# Patient Record
Sex: Female | Born: 1955 | Race: Black or African American | Marital: Married | Smoking: Never smoker
Health system: Southern US, Community
[De-identification: ages and names within clinical notes are randomized; demographics above are authoritative.]

## PROBLEM LIST (undated history)

## (undated) DIAGNOSIS — I10 Essential (primary) hypertension: Secondary | ICD-10-CM

## (undated) DIAGNOSIS — M549 Dorsalgia, unspecified: Secondary | ICD-10-CM

## (undated) DIAGNOSIS — M1990 Unspecified osteoarthritis, unspecified site: Secondary | ICD-10-CM

## (undated) DIAGNOSIS — E7800 Pure hypercholesterolemia, unspecified: Secondary | ICD-10-CM

## (undated) DIAGNOSIS — I2510 Atherosclerotic heart disease of native coronary artery without angina pectoris: Secondary | ICD-10-CM

## (undated) HISTORY — PX: BUNIONECTOMY: SHX129

## (undated) HISTORY — PX: BACK SURGERY: SHX140

## (undated) HISTORY — PX: KNEE SURGERY: SHX244

## (undated) HISTORY — PX: TONSILLECTOMY: SUR1361

---

## 2012-04-30 ENCOUNTER — Other Ambulatory Visit: Payer: Self-pay | Admitting: Neurosurgery

## 2012-04-30 DIAGNOSIS — M549 Dorsalgia, unspecified: Secondary | ICD-10-CM

## 2012-05-01 ENCOUNTER — Ambulatory Visit
Admission: RE | Admit: 2012-05-01 | Discharge: 2012-05-01 | Disposition: A | Discharge status: Home / Self Care | Payer: PRIVATE HEALTH INSURANCE | Source: Ambulatory Visit | Attending: Neurosurgery | Admitting: Neurosurgery

## 2012-05-01 ENCOUNTER — Inpatient Hospital Stay
Admission: RE | Admit: 2012-05-01 | Discharge: 2012-05-01 | Disposition: A | Discharge status: Home / Self Care | Payer: Self-pay | Source: Ambulatory Visit | Attending: Neurosurgery | Admitting: Neurosurgery

## 2012-05-01 ENCOUNTER — Other Ambulatory Visit: Payer: Self-pay | Admitting: Neurosurgery

## 2012-05-01 VITALS — BP 113/63 | HR 65 | Ht 63.0 in | Wt 156.0 lb

## 2012-05-01 DIAGNOSIS — M549 Dorsalgia, unspecified: Secondary | ICD-10-CM

## 2012-05-01 DIAGNOSIS — R52 Pain, unspecified: Secondary | ICD-10-CM

## 2012-05-01 MED ORDER — DIAZEPAM 5 MG PO TABS
10.0000 mg | ORAL_TABLET | Freq: Once | ORAL | Status: AC
  Administered 2012-05-01: 10 mg via ORAL

## 2012-05-01 MED ORDER — IOHEXOL 180 MG/ML  SOLN
15.0000 mL | Freq: Once | INTRAMUSCULAR | Status: AC | PRN
  Administered 2012-05-01: 15 mL via INTRATHECAL

## 2015-09-15 ENCOUNTER — Other Ambulatory Visit: Payer: Self-pay | Admitting: Hematology & Oncology

## 2019-11-13 ENCOUNTER — Emergency Department (HOSPITAL_BASED_OUTPATIENT_CLINIC_OR_DEPARTMENT_OTHER)
Admission: EM | Admit: 2019-11-13 | Discharge: 2019-11-13 | Disposition: A | Discharge status: Home / Self Care | Payer: 59 | Attending: Emergency Medicine | Admitting: Emergency Medicine

## 2019-11-13 ENCOUNTER — Other Ambulatory Visit: Payer: Self-pay

## 2019-11-13 ENCOUNTER — Encounter (HOSPITAL_BASED_OUTPATIENT_CLINIC_OR_DEPARTMENT_OTHER): Payer: Self-pay

## 2019-11-13 ENCOUNTER — Emergency Department (HOSPITAL_BASED_OUTPATIENT_CLINIC_OR_DEPARTMENT_OTHER): Payer: 59

## 2019-11-13 DIAGNOSIS — R202 Paresthesia of skin: Secondary | ICD-10-CM | POA: Insufficient documentation

## 2019-11-13 DIAGNOSIS — I10 Essential (primary) hypertension: Secondary | ICD-10-CM | POA: Insufficient documentation

## 2019-11-13 DIAGNOSIS — I2510 Atherosclerotic heart disease of native coronary artery without angina pectoris: Secondary | ICD-10-CM | POA: Diagnosis not present

## 2019-11-13 DIAGNOSIS — Z79899 Other long term (current) drug therapy: Secondary | ICD-10-CM | POA: Diagnosis not present

## 2019-11-13 HISTORY — DX: Atherosclerotic heart disease of native coronary artery without angina pectoris: I25.10

## 2019-11-13 HISTORY — DX: Essential (primary) hypertension: I10

## 2019-11-13 HISTORY — DX: Unspecified osteoarthritis, unspecified site: M19.90

## 2019-11-13 HISTORY — DX: Pure hypercholesterolemia, unspecified: E78.00

## 2019-11-13 HISTORY — DX: Dorsalgia, unspecified: M54.9

## 2019-11-13 LAB — CBC WITH DIFFERENTIAL/PLATELET
Abs Immature Granulocytes: 0.01 10*3/uL (ref 0.00–0.07)
Basophils Absolute: 0.1 10*3/uL (ref 0.0–0.1)
Basophils Relative: 2 %
Eosinophils Absolute: 0.1 10*3/uL (ref 0.0–0.5)
Eosinophils Relative: 1 %
HCT: 39.1 % (ref 36.0–46.0)
Hemoglobin: 12.6 g/dL (ref 12.0–15.0)
Immature Granulocytes: 0 %
Lymphocytes Relative: 42 %
Lymphs Abs: 1.5 10*3/uL (ref 0.7–4.0)
MCH: 28.9 pg (ref 26.0–34.0)
MCHC: 32.2 g/dL (ref 30.0–36.0)
MCV: 89.7 fL (ref 80.0–100.0)
Monocytes Absolute: 0.4 10*3/uL (ref 0.1–1.0)
Monocytes Relative: 10 %
Neutro Abs: 1.6 10*3/uL — ABNORMAL LOW (ref 1.7–7.7)
Neutrophils Relative %: 45 %
Platelets: 363 10*3/uL (ref 150–400)
RBC: 4.36 MIL/uL (ref 3.87–5.11)
RDW: 13.2 % (ref 11.5–15.5)
WBC: 3.5 10*3/uL — ABNORMAL LOW (ref 4.0–10.5)
nRBC: 0 % (ref 0.0–0.2)

## 2019-11-13 LAB — BASIC METABOLIC PANEL
Anion gap: 10 (ref 5–15)
BUN: 10 mg/dL (ref 8–23)
CO2: 26 mmol/L (ref 22–32)
Calcium: 9.7 mg/dL (ref 8.9–10.3)
Chloride: 105 mmol/L (ref 98–111)
Creatinine, Ser: 0.74 mg/dL (ref 0.44–1.00)
GFR, Estimated: >60 mL/min (ref >60)
Glucose, Bld: 98 mg/dL (ref 70–99)
Potassium: 3.3 mmol/L — ABNORMAL LOW (ref 3.5–5.1)
Sodium: 141 mmol/L (ref 135–145)

## 2019-11-13 LAB — MAGNESIUM: Magnesium: 1.8 mg/dL (ref 1.7–2.4)

## 2019-11-13 NOTE — ED Provider Notes (Signed)
[facility] EMERGENCY DEPARTMENT Provider Note   CSN: [id number] Arrival date & time: 11/13/19  1309     History Chief Complaint  Patient presents with  . Numbness    April Hall is a 64 y.o. female.  Patient is a 64 year old female with a history of hypertension, high cholesterol who is presenting today with left-sided facial numbness and tingling.  Patient reports that she had these symptoms approximately 6 months ago that lasted for a few hours and went away.  Then yesterday she noticed after getting up in the morning she had the numbness returned to the left side of her face.  It involves her eyelid, left cheek, left side of her lip and chin.  It does not involve her forehead or neck or lower body.  She has no weakness anywhere on her body and no difficulty with gait.  Her food taste the same and she is able to swallow normally.  She denies any vision changes.  No tinnitus.  She denies any headaches or pain in her face.  She has not had any recent congestion, illness, fever, medication changes.  Patient walks approximately 4 miles every day and has lost 20 pounds in the last several months.  She denies any dental pain.  She last saw her doctor approximately 6 months ago for cholesterol check but otherwise has been doing well.  The history is provided by the patient.       Past Medical History:  Diagnosis Date  . Arthritis   . Back pain   . CAD (coronary artery disease)   . High cholesterol   . Hypertension     There are no problems to display for this patient.   Past Surgical History:  Procedure Laterality Date  . BACK SURGERY    . BUNIONECTOMY    . CESAREAN SECTION    . KNEE SURGERY    . TONSILLECTOMY       OB History   No obstetric history on file.     No family history on file.  Social History   Tobacco Use  . Smoking status: Never Smoker  . Smokeless tobacco: Never Used  Vaping Use  . Vaping Use: Never used  Substance Use Topics  .  Alcohol use: Never  . Drug use: Never    Home Medications Prior to Admission medications   Medication Sig Start Date End Date Taking? Authorizing Provider  VITAMIN D PO Take by mouth.   Yes [provider]  amLODipine (NORVASC) 10 MG tablet Take 10 mg by mouth daily. 09/11/19   [provider]  metoprolol succinate (TOPROL-XL) 50 MG 24 hr tablet Take 50 mg by mouth daily. 09/11/19   [provider]  Omega-3 1000 MG CAPS Take by mouth.    [provider]  pravastatin (PRAVACHOL) 20 MG tablet Take 20 mg by mouth at bedtime. 06/15/19   [provider]    Allergies    Penicillins  Review of Systems   Review of Systems  All other systems reviewed and are negative.   Physical Exam Updated Vital Signs BP (!) 147/75 (BP Location: Left Arm)   Pulse 75   Temp 97.9 F (36.6 C) (Oral)   Resp 16   Ht 5\' 2"  (1.575 m)   Wt 70.5 kg   SpO2 100%   BMI 28.42 kg/m   Physical Exam Vitals and nursing note reviewed.  Constitutional:      General: She is not in acute distress.  Appearance: Normal appearance. She is well-developed and normal weight. She is not ill-appearing.  HENT:     Head: Normocephalic and atraumatic.     Right Ear: Tympanic membrane and ear canal normal.     Left Ear: Tympanic membrane and ear canal normal.     Nose: Nose normal.     Mouth/Throat:     Mouth: Mucous membranes are moist.  Eyes:     General: No visual field deficit.    Extraocular Movements: Extraocular movements intact.     Conjunctiva/sclera: Conjunctivae normal.     Pupils: Pupils are equal, round, and reactive to light.  Neck:     Vascular: No carotid bruit.  Cardiovascular:     Rate and Rhythm: Normal rate and regular rhythm.     Heart sounds: Normal heart sounds. No murmur heard.  No friction rub.  Pulmonary:     Effort: Pulmonary effort is normal.     Breath sounds: Normal breath sounds. No wheezing or rales.  Musculoskeletal:        General: No  tenderness. Normal range of motion.     Cervical back: Normal range of motion and neck supple. No tenderness.     Right lower leg: No edema.     Left lower leg: No edema.     Comments: No edema  Skin:    General: Skin is warm and dry.     Capillary Refill: Capillary refill takes less than 2 seconds.     Findings: No rash.  Neurological:     Mental Status: She is alert and oriented to person, place, and time.     Cranial Nerves: Cranial nerve deficit present. No dysarthria or facial asymmetry.     Sensory: Sensory deficit present.     Motor: No weakness or pronator drift.     Coordination: Coordination is intact.     Gait: Gait normal.     Comments: Decreased sensation over the left eyelid, cheek, left side of the lip and chin.  Forehead sparing.  Normal strength in the left side of face with no tongue deviation.  Able to blow out cheeks, can keep eyes shut tightly and can raise eyebrows equally  Psychiatric:        Mood and Affect: Mood normal.        Behavior: Behavior normal.        Thought Content: Thought content normal.     ED Results / Procedures / Treatments   Labs (all labs ordered are listed, but only abnormal results are displayed) Labs Reviewed  CBC WITH DIFFERENTIAL/PLATELET - Abnormal; Notable for the following components:      Result Value   WBC 3.5 (*)    Neutro Abs 1.6 (*)    All other components within normal limits  BASIC METABOLIC PANEL - Abnormal; Notable for the following components:   Potassium 3.3 (*)    All other components within normal limits  MAGNESIUM    EKG None  Radiology CT Head Wo Contrast  Result Date: 11/13/2019 CLINICAL DATA:  Left facial numbness and tingling for 2 days EXAM: CT HEAD WITHOUT CONTRAST TECHNIQUE: Contiguous axial images were obtained from the base of the skull through the vertex without intravenous contrast. COMPARISON:  None. FINDINGS: Brain: No acute infarct or hemorrhage. Lateral ventricles and midline structures are  unremarkable. No acute extra-axial fluid collections. No mass effect. Vascular: No hyperdense vessel or unexpected calcification. Skull: Normal. Negative for fracture or focal lesion. Sinuses/Orbits: No acute finding. Other: None.  IMPRESSION: 1. No acute intracranial process. Electronically Signed   By: Sharlet Salina M.D.   On: 11/13/2019 15:49    Procedures Procedures (including critical care time)  Medications Ordered in ED Medications - No data to display  ED Course  I have reviewed the triage vital signs and the nursing notes.  Pertinent labs & imaging results that were available during my care of the patient were reviewed by me and considered in my medical decision making (see chart for details).    MDM Rules/Calculators/A&P                          Patient is a well-appearing 64 year old female who is presenting today with numbness in the left side of the face mostly in the trigeminal nerve distribution but does seem to spare the forehead.  She described it as a numbness and tingling but there is no pain associated with it.  She had the symptoms once approximately 6 months ago that lasted for several hours and then resolved.  Symptoms did return yesterday for approximately 4 hours and then resolved and then has returned today and been persistent.  Does report that it is getting slightly better than it was earlier.  She has no notable weakness or other acute neuro deficits.  She does not have a rash or findings consistent with zoster.    Lower suspicion for stroke at this time.  Concern for possible lesions such as schwannoma of the nerve bundle.  Patient is not having headaches and denies any recent medication changes.  Low suspicion for infection.  Hearing vision and swallowing is all normal.  Labs without evidence of electrolyte abnormality, magnesium or calcium issues or diabetes.  Head CT without acute findings of large mass, bleed or evidence of stroke.  Discussed findings with patient.   Encouraged her to follow-up with her doctor she may need an MRI for further evaluation and neurology follow up.  MDM Number of Diagnoses or Management Options   Amount and/or Complexity of Data Reviewed Clinical lab tests: ordered and reviewed Tests in the radiology section of CPT: ordered and reviewed Obtain history from someone other than the patient: no Discuss the patient with other providers: no Independent visualization of images, tracings, or specimens: yes  Risk of Complications, Morbidity, and/or Mortality Presenting problems: moderate Diagnostic procedures: minimal Management options: minimal  Patient Progress Patient progress: stable   Final Clinical Impression(s) / ED Diagnoses Final diagnoses:  Paresthesia    Rx / DC Orders ED Discharge Orders    None       Gwyneth Sprout, MD 11/13/19 [phone]

## 2019-11-13 NOTE — Discharge Instructions (Signed)
If you develop facial drooping, trouble swallowing, vision changes, weakness or numbness on 1 side of you body return to the ER

## 2019-11-13 NOTE — ED Triage Notes (Addendum)
Pt c/o numbness to left side of face x 4 days ~26months ago after covid vaccine-c/o left side of face started ~730am yesterday-points to left cheek, left corner of mouth and left upper eyelid for c/o site-no facial droop noted-denies weakness, difficulty speech and swallowing-also denies pain-NAD-steady gait

## 2021-04-14 IMAGING — CT CT HEAD W/O CM
3 series · 16 of 47 positions shown, 19 images · non-contrast
Comparison: None.

CLINICAL DATA: Left facial numbness and tingling for 2 days

EXAM:
CT HEAD WITHOUT CONTRAST
TECHNIQUE: Contiguous axial images were obtained from the base of the skull
through the vertex without intravenous contrast.

[Series 2: head wo · axial · 0.44mm/px · z∈[+1188,+1328]mm · 10 of 34 slices shown, 13 images]
[im 3/34  brain]
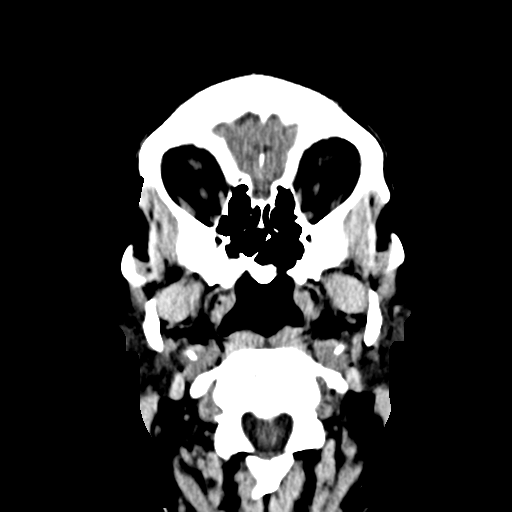
[im 3/34  bone]
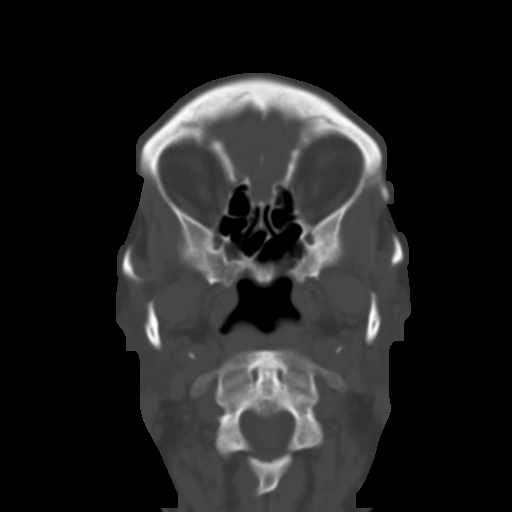
[im 6/34  brain]
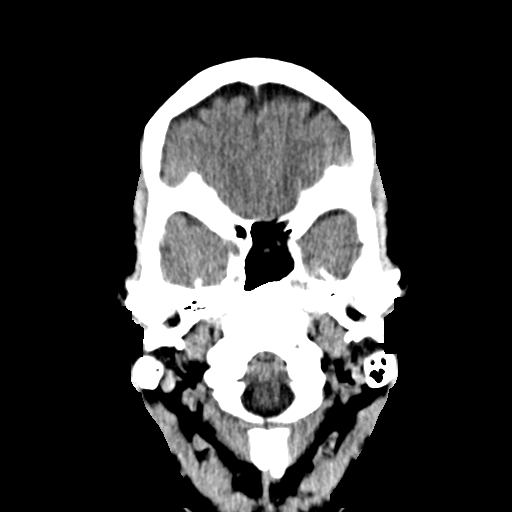
[im 10/34  brain]
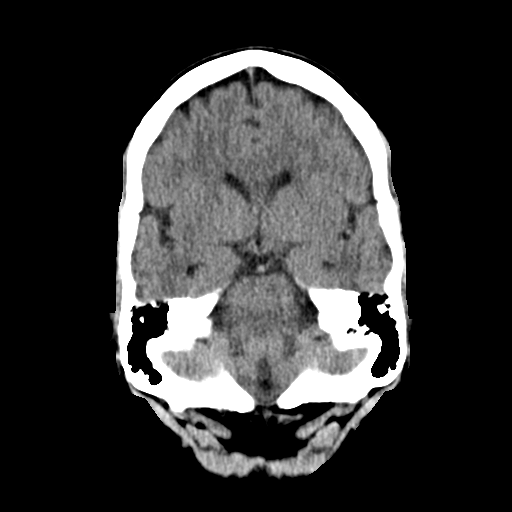
[im 12/34  brain]
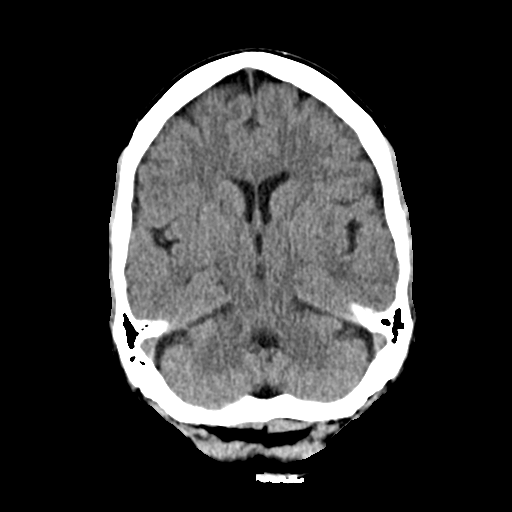
[im 15/34  brain]
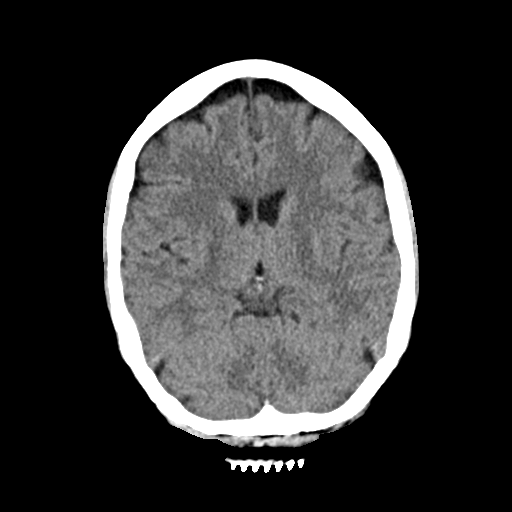
[im 15/34  bone]
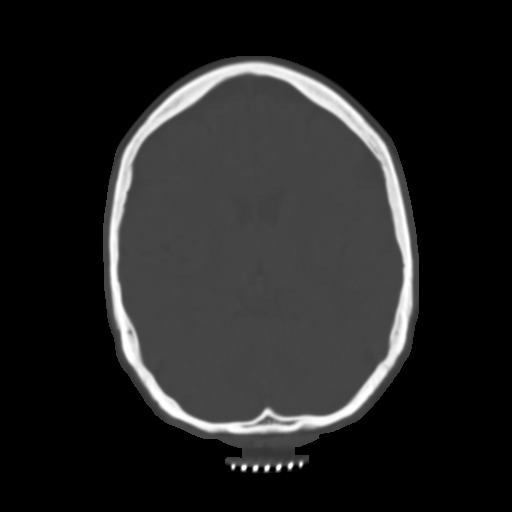
[im 19/34  brain]
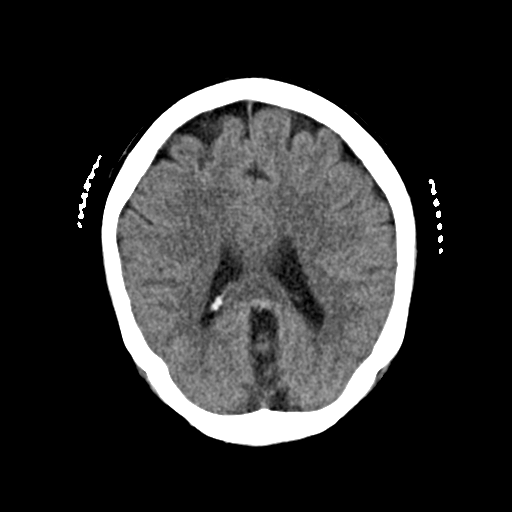
[im 22/34  brain]
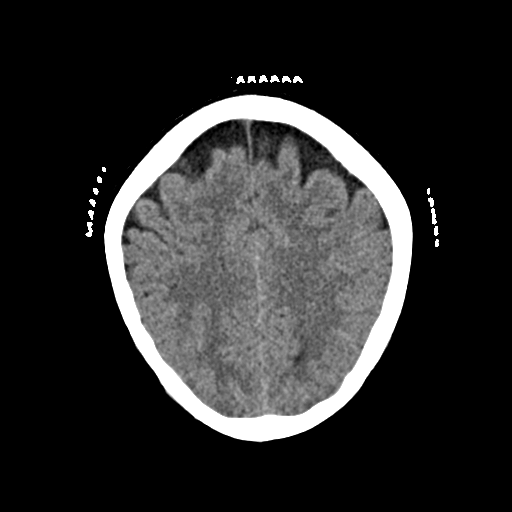
[im 26/34  brain]
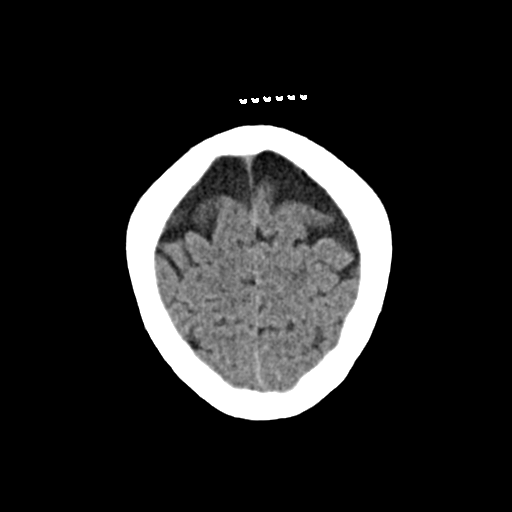
[im 28/34  brain]
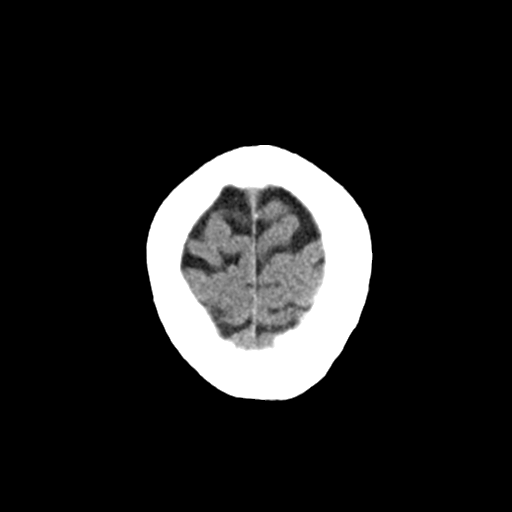
[im 28/34  bone]
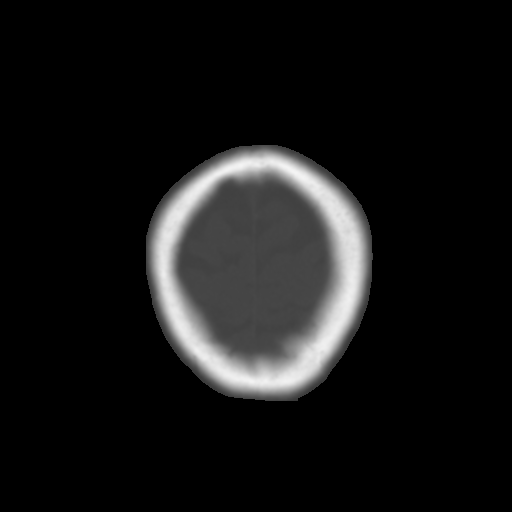
[im 31/34  brain]
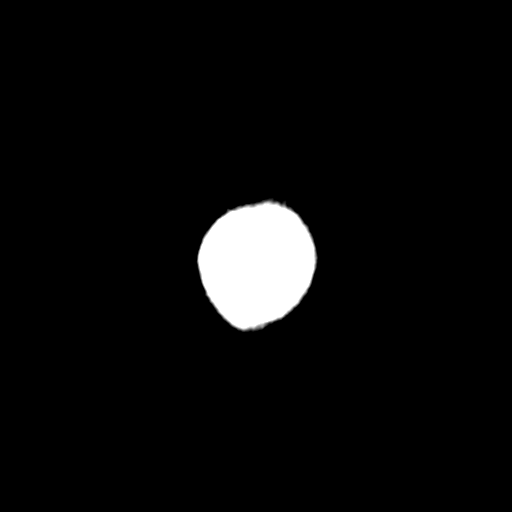

[Series 4: coronal soft · coronal · 0.32mm/px · 3 of 63 slices shown]
[im 21/63  brain]
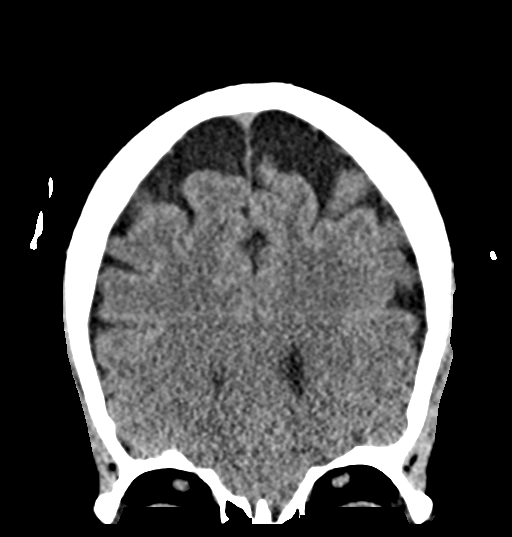
[im 28/63  brain]
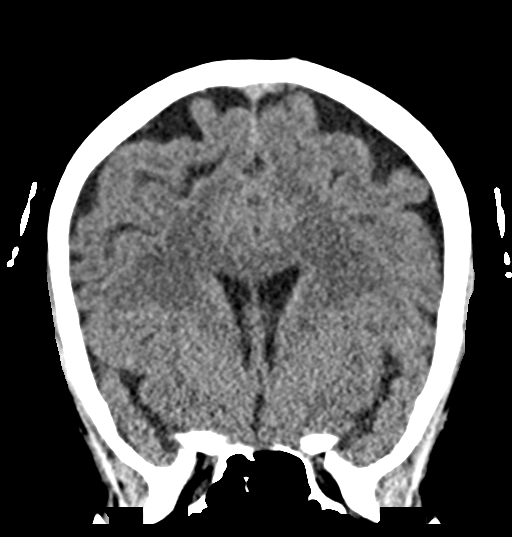
[im 35/63  brain]
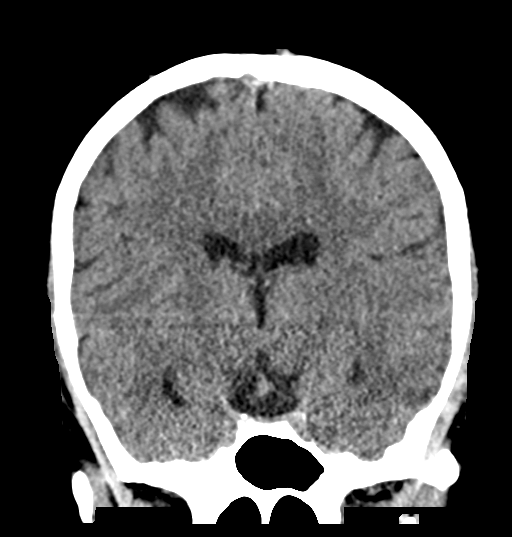

[Series 5: sag soft · sagittal · 0.33mm/px · 3 of 53 slices shown]
[im 18/53  brain]
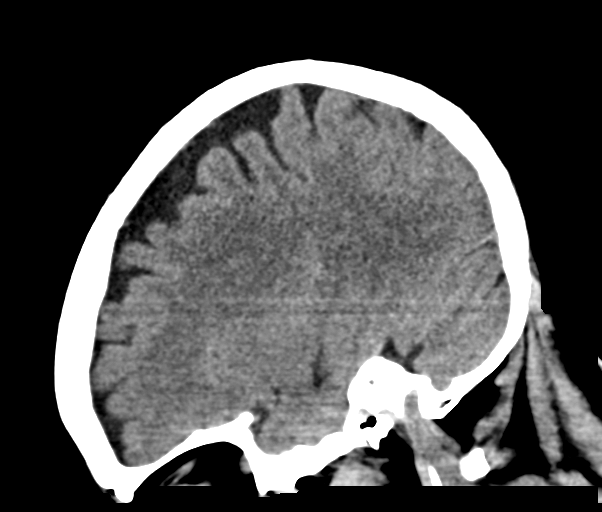
[im 27/53  brain]
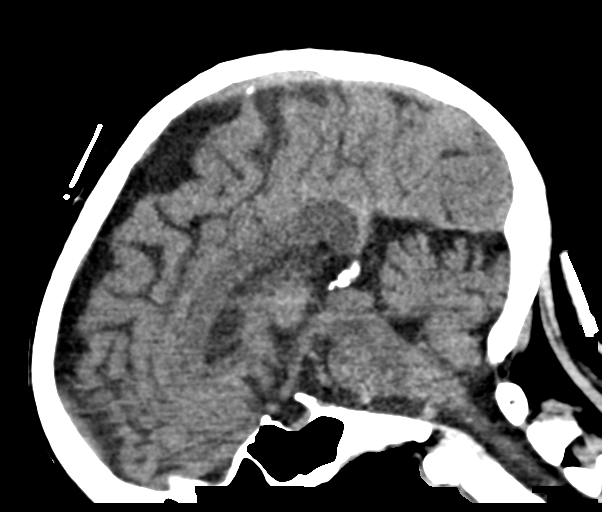
[im 35/53  brain]
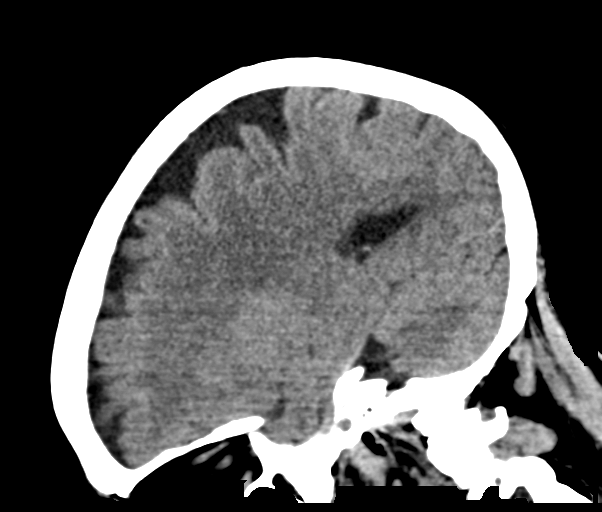

[16 of 47 positions shown; findings below may reference images not displayed]

FINDINGS: Brain: No acute infarct or hemorrhage. Lateral ventricles and
midline structures are unremarkable. No acute extra-axial fluid
collections. No mass effect.

Vascular: No hyperdense vessel or unexpected calcification.

Skull: Normal. Negative for fracture or focal lesion.

Sinuses/Orbits: No acute finding.

Other: None.
IMPRESSION: 1. No acute intracranial process.
